# Patient Record
Sex: Female | Born: 1950
Health system: Southern US, Community
[De-identification: ages and names within clinical notes are randomized; demographics above are authoritative.]

## PROBLEM LIST (undated history)

## (undated) DIAGNOSIS — E785 Hyperlipidemia, unspecified: Secondary | ICD-10-CM

## (undated) DIAGNOSIS — R9431 Abnormal electrocardiogram [ECG] [EKG]: Secondary | ICD-10-CM

## (undated) DIAGNOSIS — I10 Essential (primary) hypertension: Secondary | ICD-10-CM

## (undated) DIAGNOSIS — J302 Other seasonal allergic rhinitis: Secondary | ICD-10-CM

## (undated) DIAGNOSIS — K219 Gastro-esophageal reflux disease without esophagitis: Secondary | ICD-10-CM

## (undated) HISTORY — DX: Hyperlipidemia, unspecified: E78.5

## (undated) HISTORY — DX: Essential (primary) hypertension: I10

## (undated) HISTORY — DX: Abnormal electrocardiogram (ECG) (EKG): R94.31

## (undated) HISTORY — DX: Other seasonal allergic rhinitis: J30.2

## (undated) HISTORY — DX: Gastro-esophageal reflux disease without esophagitis: K21.9

---

## 1999-07-20 ENCOUNTER — Other Ambulatory Visit: Admission: RE | Admit: 1999-07-20 | Discharge: 1999-07-20 | Payer: Self-pay | Admitting: Obstetrics and Gynecology

## 2000-08-14 ENCOUNTER — Other Ambulatory Visit: Admission: RE | Admit: 2000-08-14 | Discharge: 2000-08-14 | Payer: Self-pay | Admitting: Obstetrics and Gynecology

## 2001-09-17 ENCOUNTER — Other Ambulatory Visit: Admission: RE | Admit: 2001-09-17 | Discharge: 2001-09-17 | Payer: Self-pay | Admitting: Obstetrics and Gynecology

## 2001-10-02 ENCOUNTER — Encounter: Payer: Self-pay | Admitting: Obstetrics and Gynecology

## 2001-10-02 ENCOUNTER — Ambulatory Visit (HOSPITAL_COMMUNITY): Admission: RE | Admit: 2001-10-02 | Discharge: 2001-10-02 | Payer: Self-pay | Admitting: Obstetrics and Gynecology

## 2003-02-10 ENCOUNTER — Encounter: Payer: Self-pay | Admitting: Obstetrics and Gynecology

## 2003-02-10 ENCOUNTER — Ambulatory Visit (HOSPITAL_COMMUNITY): Admission: RE | Admit: 2003-02-10 | Discharge: 2003-02-10 | Payer: Self-pay | Admitting: Obstetrics and Gynecology

## 2003-02-12 ENCOUNTER — Other Ambulatory Visit: Admission: RE | Admit: 2003-02-12 | Discharge: 2003-02-12 | Payer: Self-pay | Admitting: Obstetrics and Gynecology

## 2003-02-27 ENCOUNTER — Ambulatory Visit (HOSPITAL_COMMUNITY): Admission: RE | Admit: 2003-02-27 | Discharge: 2003-02-27 | Payer: Self-pay | Admitting: Oncology

## 2003-02-27 ENCOUNTER — Encounter: Payer: Self-pay | Admitting: Oncology

## 2005-06-16 ENCOUNTER — Ambulatory Visit (HOSPITAL_COMMUNITY): Admission: RE | Admit: 2005-06-16 | Discharge: 2005-06-16 | Payer: Self-pay | Admitting: Internal Medicine

## 2005-08-11 ENCOUNTER — Ambulatory Visit (HOSPITAL_COMMUNITY): Admission: RE | Admit: 2005-08-11 | Discharge: 2005-08-11 | Payer: Self-pay | Admitting: Gastroenterology

## 2007-09-09 ENCOUNTER — Ambulatory Visit (HOSPITAL_COMMUNITY): Admission: RE | Admit: 2007-09-09 | Discharge: 2007-09-09 | Payer: Self-pay | Admitting: Internal Medicine

## 2009-07-09 ENCOUNTER — Encounter: Admission: RE | Admit: 2009-07-09 | Discharge: 2009-07-09 | Payer: Self-pay | Admitting: Interventional Cardiology

## 2012-10-03 ENCOUNTER — Other Ambulatory Visit (HOSPITAL_COMMUNITY): Payer: Self-pay | Admitting: Internal Medicine

## 2012-10-03 DIAGNOSIS — I10 Essential (primary) hypertension: Secondary | ICD-10-CM

## 2012-10-28 ENCOUNTER — Ambulatory Visit (HOSPITAL_COMMUNITY)
Admission: RE | Admit: 2012-10-28 | Discharge: 2012-10-28 | Disposition: A | Discharge status: Home / Self Care | Payer: Federal, State, Local not specified - PPO | Source: Ambulatory Visit | Attending: Cardiovascular Disease | Admitting: Cardiovascular Disease

## 2012-10-28 DIAGNOSIS — I10 Essential (primary) hypertension: Secondary | ICD-10-CM | POA: Insufficient documentation

## 2012-10-28 NOTE — Progress Notes (Signed)
Renal duplex completed.  

## 2014-03-30 ENCOUNTER — Encounter (INDEPENDENT_AMBULATORY_CARE_PROVIDER_SITE_OTHER): Payer: Self-pay

## 2014-03-30 ENCOUNTER — Ambulatory Visit (INDEPENDENT_AMBULATORY_CARE_PROVIDER_SITE_OTHER): Payer: Medicare Other | Admitting: Interventional Cardiology

## 2014-03-30 ENCOUNTER — Encounter: Payer: Self-pay | Admitting: Interventional Cardiology

## 2014-03-30 VITALS — BP 160/100 | HR 60 | Ht 61.0 in | Wt 147.0 lb

## 2014-03-30 DIAGNOSIS — E785 Hyperlipidemia, unspecified: Secondary | ICD-10-CM

## 2014-03-30 DIAGNOSIS — J309 Allergic rhinitis, unspecified: Secondary | ICD-10-CM | POA: Diagnosis not present

## 2014-03-30 DIAGNOSIS — J302 Other seasonal allergic rhinitis: Secondary | ICD-10-CM

## 2014-03-30 DIAGNOSIS — K219 Gastro-esophageal reflux disease without esophagitis: Secondary | ICD-10-CM | POA: Diagnosis not present

## 2014-03-30 DIAGNOSIS — I10 Essential (primary) hypertension: Secondary | ICD-10-CM | POA: Diagnosis not present

## 2014-03-30 DIAGNOSIS — R9431 Abnormal electrocardiogram [ECG] [EKG]: Secondary | ICD-10-CM | POA: Diagnosis not present

## 2014-03-30 NOTE — Patient Instructions (Signed)
Your physician recommends that you continue on your current medications as directed. Please refer to the Current Medication list given to you today.  Your physician has requested that you have an echocardiogram. Echocardiography is a painless test that uses sound waves to create images of your heart. It provides your doctor with information about the size and shape of your heart and how well your heart's chambers and valves are working. This procedure takes approximately one hour. There are no restrictions for this procedure.  Follow up pending Results

## 2014-03-30 NOTE — Progress Notes (Signed)
Patient ID: Destiny James, female   DOB: 26-Feb-1951, 63 y.o.   MRN: [id number]    [address], [address] [place], [place]  [address] Phone: [phone] Fax:  [phone]  Date:  03/30/2014   ID:  Destiny James, DOB [birth year], MRN [id number]  PCP:  No primary provider on file.   ASSESSMENT: 1. Hypertension, with elevated BP today. The issue here is that she feels her pressure is only elevated at times and physician's offices. I have always been concerned that she has undertreated/untreated hypertension. Current regimen is only Tenormin 12.5 mg per day. 2. Abnormal EKG with diffuse T wave abnormality, a new finding compared with prior EKG from 5 years ago. 3. Hyperlipidemia 4. Seasonal allergies   PLAN:  1. 2-D Doppler echocardiogram to evaluate for regional wall motion abnormality and LVH. 2. Further recommendations upon results of echo. I suspect the echo demonstrates significant LVH and that it may be reasonable for [place] to intensify antihypertensive therapy. 3. Management of her blood pressure will require her acceptance of the diagnosis and compliance with medical regimen   SUBJECTIVE: Destiny James is a 63 y.o. female who was seen in greater than 5 years ago at [place]. At that time she was having chest pain. Evaluation with a myocardial perfusion study was unremarkable. Was later felt the chest discomfort was related to reflux. There was concern at that time that her blood pressure was being undertreated.   Wt Readings from Last 3 Encounters:  03/30/14 147 lb (66.679 kg)    Past Medical History  Diagnosis Date  . Hyperlipidemia   . Hypertension   . Seasonal allergies   . Abnormal EKG 03/31/2014    Diffuse T-wave abnormality   . Esophageal reflux 03/31/2014   Family History  Problem Relation Age of Onset  . Other Mother    Current Outpatient Prescriptions  Medication Sig Dispense Refill  . atenolol (TENORMIN) 25 MG tablet Take 12.5 mg by mouth daily.       No current  facility-administered medications for this visit.    Allergies:   Allergies not on file  Social History:  The patient  reports that she has never smoked. She does not have any smokeless tobacco history on file.   ROS:  Please see the history of present illness.   No difficulty swallowing. Allergies continue to be a problem. She takes multiple supplements. The patient feels that she does not have a problem with hypertension. She prefers to manage any medical problem naturally. She was resistant to more aggressive therapy recommended 5 years ago the  All other systems reviewed and negative.   OBJECTIVE: VS:  BP 160/100  Pulse 60  Ht 5\' 1"  (1.549 m)  Wt 147 lb (66.679 kg)  BMI 27.79 kg/m2 Well nourished, well developed, in no acute distress, younger than stated age 43: normal Neck: JVD flat. Carotid bruit absent  Cardiac:  normal S1, S2; RRR; no murmur. An S4 gallop is audible Lungs:  clear to auscultation bilaterally, no wheezing, rhonchi or rales Abd: soft, nontender, no hepatomegaly Ext: Edema absent. Pulses 2+ Skin: warm and dry Neuro:  CNs 2-12 intact, no focal abnormalities noted  EKG:  Normal sinus rhythm with diffuse symmetrical T wave abnormality. Ischemia versus secondary changes of LVH should be considered. This is a change when compared to an EKG from 2010.       Signed, Illene Labrador III, MD 03/30/2014 9:26 AM

## 2014-03-31 ENCOUNTER — Encounter: Payer: Self-pay | Admitting: Interventional Cardiology

## 2014-03-31 DIAGNOSIS — E785 Hyperlipidemia, unspecified: Secondary | ICD-10-CM | POA: Insufficient documentation

## 2014-03-31 DIAGNOSIS — K219 Gastro-esophageal reflux disease without esophagitis: Secondary | ICD-10-CM

## 2014-03-31 DIAGNOSIS — J302 Other seasonal allergic rhinitis: Secondary | ICD-10-CM | POA: Insufficient documentation

## 2014-03-31 DIAGNOSIS — R9431 Abnormal electrocardiogram [ECG] [EKG]: Secondary | ICD-10-CM

## 2014-03-31 DIAGNOSIS — I10 Essential (primary) hypertension: Secondary | ICD-10-CM | POA: Insufficient documentation

## 2014-03-31 HISTORY — DX: Gastro-esophageal reflux disease without esophagitis: K21.9

## 2014-03-31 HISTORY — DX: Abnormal electrocardiogram (ECG) (EKG): R94.31

## 2014-04-16 ENCOUNTER — Ambulatory Visit (HOSPITAL_COMMUNITY): Payer: Medicare Other | Attending: Cardiology | Admitting: Radiology

## 2014-04-16 DIAGNOSIS — R9431 Abnormal electrocardiogram [ECG] [EKG]: Secondary | ICD-10-CM | POA: Insufficient documentation

## 2014-04-16 DIAGNOSIS — I379 Nonrheumatic pulmonary valve disorder, unspecified: Secondary | ICD-10-CM | POA: Diagnosis not present

## 2014-04-16 DIAGNOSIS — I10 Essential (primary) hypertension: Secondary | ICD-10-CM | POA: Diagnosis not present

## 2014-04-16 DIAGNOSIS — E785 Hyperlipidemia, unspecified: Secondary | ICD-10-CM | POA: Diagnosis not present

## 2014-04-16 DIAGNOSIS — I059 Rheumatic mitral valve disease, unspecified: Secondary | ICD-10-CM | POA: Diagnosis not present

## 2014-04-16 NOTE — Progress Notes (Signed)
Echocardiogram performed.  

## 2014-04-20 ENCOUNTER — Encounter (HOSPITAL_COMMUNITY): Payer: Self-pay | Admitting: Interventional Cardiology

## 2014-04-21 ENCOUNTER — Telehealth: Payer: Self-pay

## 2014-04-21 NOTE — Telephone Encounter (Signed)
Message copied by Lamar Laundry on Tue Apr 21, 2014  8:40 AM ------      Message from: Daneen Schick      Created: Fri Apr 17, 2014  4:58 PM       Let her know study shows mild thickening of the heart muscle. He need to pay close attention to BP control < 130 mmHg ------

## 2014-04-21 NOTE — Telephone Encounter (Signed)
Message copied by Lamar Laundry on Tue Apr 21, 2014  5:16 PM ------      Message from: Daneen Schick      Created: Fri Apr 17, 2014  4:58 PM       Let her know study shows mild thickening of the heart muscle. He need to pay close attention to BP control < 130 mmHg ------

## 2014-04-21 NOTE — Telephone Encounter (Signed)
Follow up    Pt returning Lisa's call.

## 2014-04-21 NOTE — Telephone Encounter (Signed)
pt aware of echo results and Dr.smith instructions.the echoshows mild thickening of the heart muscle.she need to pay close attention to BP control < 130 mmHg.pt adv to monitor bp 1-2 times weekly and to call the office if she having consistent elevated readings.pt agreeable and verbalized understanding

## 2014-04-21 NOTE — Telephone Encounter (Signed)
called to give pt echo results and Dr.Smith's instructions lmtcb.

## 2014-04-22 ENCOUNTER — Telehealth: Payer: Self-pay

## 2014-04-22 MED ORDER — ATENOLOL 25 MG PO TABS: 25.0000 mg | ORAL_TABLET | Freq: Every day | ORAL | Status: AC

## 2014-04-22 NOTE — Telephone Encounter (Signed)
Message copied by Lamar Laundry on Wed Apr 22, 2014  2:52 PM ------      Message from: Daneen Schick      Created: Wed Apr 22, 2014  1:53 PM       Increase the atenolol to 25 mg daily and measure BP 2-3 times a week for 1 month. Low salt diet. Aerobic exercise, starting slow and gradually building. ------

## 2014-04-22 NOTE — Telephone Encounter (Signed)
Dr.Smith.pt given Dr.Smiths recommendations.Increase the atenolol to 25 mg daily and measure BP 2-3 times a week for 1 month. Low salt diet. Aerobic exercise, starting slow and gradually building. Pt agreeable and verbalized understanding

## 2014-04-23 NOTE — Telephone Encounter (Signed)
error 

## 2014-05-25 DIAGNOSIS — H02409 Unspecified ptosis of unspecified eyelid: Secondary | ICD-10-CM | POA: Diagnosis not present

## 2014-07-29 DIAGNOSIS — Z79899 Other long term (current) drug therapy: Secondary | ICD-10-CM | POA: Diagnosis not present

## 2014-07-29 DIAGNOSIS — I10 Essential (primary) hypertension: Secondary | ICD-10-CM | POA: Diagnosis not present

## 2014-08-14 ENCOUNTER — Other Ambulatory Visit: Payer: Self-pay

## 2015-02-01 DIAGNOSIS — I10 Essential (primary) hypertension: Secondary | ICD-10-CM | POA: Diagnosis not present

## 2015-03-04 ENCOUNTER — Ambulatory Visit: Payer: Medicare Other | Admitting: Internal Medicine

## 2015-03-08 ENCOUNTER — Ambulatory Visit: Payer: Medicare Other | Admitting: Internal Medicine

## 2015-04-26 ENCOUNTER — Other Ambulatory Visit: Payer: Self-pay

## 2015-06-01 DIAGNOSIS — H10402 Unspecified chronic conjunctivitis, left eye: Secondary | ICD-10-CM | POA: Diagnosis not present

## 2015-06-01 DIAGNOSIS — H5213 Myopia, bilateral: Secondary | ICD-10-CM | POA: Diagnosis not present

## 2015-06-23 DIAGNOSIS — H0011 Chalazion right upper eyelid: Secondary | ICD-10-CM | POA: Diagnosis not present

## 2015-07-12 DIAGNOSIS — E785 Hyperlipidemia, unspecified: Secondary | ICD-10-CM | POA: Diagnosis not present

## 2015-07-12 DIAGNOSIS — I340 Nonrheumatic mitral (valve) insufficiency: Secondary | ICD-10-CM | POA: Diagnosis not present

## 2015-07-12 DIAGNOSIS — K219 Gastro-esophageal reflux disease without esophagitis: Secondary | ICD-10-CM | POA: Diagnosis not present

## 2015-07-12 DIAGNOSIS — D72819 Decreased white blood cell count, unspecified: Secondary | ICD-10-CM | POA: Diagnosis not present

## 2015-07-12 DIAGNOSIS — I10 Essential (primary) hypertension: Secondary | ICD-10-CM | POA: Diagnosis not present

## 2015-07-12 DIAGNOSIS — Z6827 Body mass index (BMI) 27.0-27.9, adult: Secondary | ICD-10-CM | POA: Diagnosis not present

## 2015-07-12 DIAGNOSIS — J302 Other seasonal allergic rhinitis: Secondary | ICD-10-CM | POA: Diagnosis not present

## 2015-07-12 DIAGNOSIS — D259 Leiomyoma of uterus, unspecified: Secondary | ICD-10-CM | POA: Diagnosis not present

## 2015-07-12 DIAGNOSIS — E663 Overweight: Secondary | ICD-10-CM | POA: Diagnosis not present

## 2015-07-12 DIAGNOSIS — Z23 Encounter for immunization: Secondary | ICD-10-CM | POA: Diagnosis not present

## 2015-07-12 DIAGNOSIS — R9431 Abnormal electrocardiogram [ECG] [EKG]: Secondary | ICD-10-CM | POA: Diagnosis not present

## 2015-07-12 DIAGNOSIS — K7689 Other specified diseases of liver: Secondary | ICD-10-CM | POA: Diagnosis not present

## 2015-07-15 DIAGNOSIS — K7689 Other specified diseases of liver: Secondary | ICD-10-CM | POA: Diagnosis not present

## 2015-07-23 ENCOUNTER — Other Ambulatory Visit: Payer: Self-pay | Admitting: Internal Medicine

## 2015-07-23 DIAGNOSIS — Z1231 Encounter for screening mammogram for malignant neoplasm of breast: Secondary | ICD-10-CM

## 2015-07-26 DIAGNOSIS — E782 Mixed hyperlipidemia: Secondary | ICD-10-CM | POA: Diagnosis not present

## 2015-07-26 DIAGNOSIS — I10 Essential (primary) hypertension: Secondary | ICD-10-CM | POA: Diagnosis not present

## 2015-07-26 DIAGNOSIS — Z1211 Encounter for screening for malignant neoplasm of colon: Secondary | ICD-10-CM | POA: Diagnosis not present

## 2015-08-03 DIAGNOSIS — R030 Elevated blood-pressure reading, without diagnosis of hypertension: Secondary | ICD-10-CM | POA: Diagnosis not present

## 2015-08-03 DIAGNOSIS — Z6827 Body mass index (BMI) 27.0-27.9, adult: Secondary | ICD-10-CM | POA: Diagnosis not present

## 2015-08-10 ENCOUNTER — Ambulatory Visit
Admission: RE | Admit: 2015-08-10 | Discharge: 2015-08-10 | Disposition: A | Discharge status: Home / Self Care | Payer: Medicare Other | Source: Ambulatory Visit | Attending: Internal Medicine | Admitting: Internal Medicine

## 2015-08-10 DIAGNOSIS — Z1231 Encounter for screening mammogram for malignant neoplasm of breast: Secondary | ICD-10-CM | POA: Diagnosis not present

## 2015-08-13 ENCOUNTER — Other Ambulatory Visit: Payer: Self-pay | Admitting: Internal Medicine

## 2015-08-13 DIAGNOSIS — R928 Other abnormal and inconclusive findings on diagnostic imaging of breast: Secondary | ICD-10-CM

## 2015-08-13 DIAGNOSIS — R030 Elevated blood-pressure reading, without diagnosis of hypertension: Secondary | ICD-10-CM | POA: Diagnosis not present

## 2015-08-20 ENCOUNTER — Other Ambulatory Visit: Payer: Self-pay | Admitting: Internal Medicine

## 2015-08-20 ENCOUNTER — Ambulatory Visit
Admission: RE | Admit: 2015-08-20 | Discharge: 2015-08-20 | Disposition: A | Discharge status: Home / Self Care | Payer: Medicare Other | Source: Ambulatory Visit | Attending: Internal Medicine | Admitting: Internal Medicine

## 2015-08-20 DIAGNOSIS — R928 Other abnormal and inconclusive findings on diagnostic imaging of breast: Secondary | ICD-10-CM

## 2015-08-20 DIAGNOSIS — N63 Unspecified lump in breast: Secondary | ICD-10-CM | POA: Diagnosis not present

## 2015-08-20 DIAGNOSIS — N6310 Unspecified lump in the right breast, unspecified quadrant: Secondary | ICD-10-CM

## 2015-08-31 ENCOUNTER — Other Ambulatory Visit: Payer: Medicare Other

## 2015-09-03 DIAGNOSIS — I10 Essential (primary) hypertension: Secondary | ICD-10-CM | POA: Diagnosis not present

## 2015-09-03 DIAGNOSIS — R030 Elevated blood-pressure reading, without diagnosis of hypertension: Secondary | ICD-10-CM | POA: Diagnosis not present

## 2015-09-03 DIAGNOSIS — I517 Cardiomegaly: Secondary | ICD-10-CM | POA: Diagnosis not present

## 2015-09-03 DIAGNOSIS — R928 Other abnormal and inconclusive findings on diagnostic imaging of breast: Secondary | ICD-10-CM | POA: Diagnosis not present

## 2015-09-03 DIAGNOSIS — Z6827 Body mass index (BMI) 27.0-27.9, adult: Secondary | ICD-10-CM | POA: Diagnosis not present

## 2015-09-07 DIAGNOSIS — Z1211 Encounter for screening for malignant neoplasm of colon: Secondary | ICD-10-CM | POA: Diagnosis not present

## 2015-09-08 ENCOUNTER — Other Ambulatory Visit: Payer: Medicare Other

## 2016-01-14 DIAGNOSIS — R8290 Unspecified abnormal findings in urine: Secondary | ICD-10-CM | POA: Diagnosis not present

## 2016-01-14 DIAGNOSIS — N390 Urinary tract infection, site not specified: Secondary | ICD-10-CM | POA: Diagnosis not present

## 2016-01-14 DIAGNOSIS — E784 Other hyperlipidemia: Secondary | ICD-10-CM | POA: Diagnosis not present

## 2016-01-14 DIAGNOSIS — I10 Essential (primary) hypertension: Secondary | ICD-10-CM | POA: Diagnosis not present

## 2016-01-21 DIAGNOSIS — Z780 Asymptomatic menopausal state: Secondary | ICD-10-CM | POA: Diagnosis not present

## 2016-01-21 DIAGNOSIS — J302 Other seasonal allergic rhinitis: Secondary | ICD-10-CM | POA: Diagnosis not present

## 2016-01-21 DIAGNOSIS — D72819 Decreased white blood cell count, unspecified: Secondary | ICD-10-CM | POA: Diagnosis not present

## 2016-01-21 DIAGNOSIS — I340 Nonrheumatic mitral (valve) insufficiency: Secondary | ICD-10-CM | POA: Diagnosis not present

## 2016-01-21 DIAGNOSIS — R928 Other abnormal and inconclusive findings on diagnostic imaging of breast: Secondary | ICD-10-CM | POA: Diagnosis not present

## 2016-01-21 DIAGNOSIS — R030 Elevated blood-pressure reading, without diagnosis of hypertension: Secondary | ICD-10-CM | POA: Diagnosis not present

## 2016-01-21 DIAGNOSIS — Z6827 Body mass index (BMI) 27.0-27.9, adult: Secondary | ICD-10-CM | POA: Diagnosis not present

## 2016-01-21 DIAGNOSIS — D259 Leiomyoma of uterus, unspecified: Secondary | ICD-10-CM | POA: Diagnosis not present

## 2016-01-21 DIAGNOSIS — Z0000 Encounter for general adult medical examination without abnormal findings: Secondary | ICD-10-CM | POA: Diagnosis not present

## 2016-01-21 DIAGNOSIS — K219 Gastro-esophageal reflux disease without esophagitis: Secondary | ICD-10-CM | POA: Diagnosis not present

## 2016-01-21 DIAGNOSIS — I10 Essential (primary) hypertension: Secondary | ICD-10-CM | POA: Diagnosis not present

## 2016-01-21 DIAGNOSIS — Z1389 Encounter for screening for other disorder: Secondary | ICD-10-CM | POA: Diagnosis not present

## 2016-01-21 DIAGNOSIS — E784 Other hyperlipidemia: Secondary | ICD-10-CM | POA: Diagnosis not present

## 2016-06-14 DIAGNOSIS — H5213 Myopia, bilateral: Secondary | ICD-10-CM | POA: Diagnosis not present

## 2016-06-14 DIAGNOSIS — H02401 Unspecified ptosis of right eyelid: Secondary | ICD-10-CM | POA: Diagnosis not present

## 2016-07-25 DIAGNOSIS — I129 Hypertensive chronic kidney disease with stage 1 through stage 4 chronic kidney disease, or unspecified chronic kidney disease: Secondary | ICD-10-CM | POA: Diagnosis not present

## 2016-07-25 DIAGNOSIS — R928 Other abnormal and inconclusive findings on diagnostic imaging of breast: Secondary | ICD-10-CM | POA: Diagnosis not present

## 2016-07-25 DIAGNOSIS — Z6828 Body mass index (BMI) 28.0-28.9, adult: Secondary | ICD-10-CM | POA: Diagnosis not present

## 2016-07-25 DIAGNOSIS — R030 Elevated blood-pressure reading, without diagnosis of hypertension: Secondary | ICD-10-CM | POA: Diagnosis not present

## 2016-07-25 DIAGNOSIS — E663 Overweight: Secondary | ICD-10-CM | POA: Diagnosis not present

## 2016-08-08 DIAGNOSIS — N6310 Unspecified lump in the right breast, unspecified quadrant: Secondary | ICD-10-CM | POA: Diagnosis not present

## 2016-08-08 DIAGNOSIS — N6314 Unspecified lump in the right breast, lower inner quadrant: Secondary | ICD-10-CM | POA: Diagnosis not present

## 2016-11-10 DIAGNOSIS — Z6828 Body mass index (BMI) 28.0-28.9, adult: Secondary | ICD-10-CM | POA: Diagnosis not present

## 2016-11-10 DIAGNOSIS — I10 Essential (primary) hypertension: Secondary | ICD-10-CM | POA: Diagnosis not present

## 2016-11-10 DIAGNOSIS — R002 Palpitations: Secondary | ICD-10-CM | POA: Diagnosis not present

## 2017-01-15 DIAGNOSIS — I10 Essential (primary) hypertension: Secondary | ICD-10-CM | POA: Diagnosis not present

## 2017-01-15 DIAGNOSIS — E784 Other hyperlipidemia: Secondary | ICD-10-CM | POA: Diagnosis not present

## 2017-01-22 DIAGNOSIS — R002 Palpitations: Secondary | ICD-10-CM | POA: Diagnosis not present

## 2017-01-22 DIAGNOSIS — D259 Leiomyoma of uterus, unspecified: Secondary | ICD-10-CM | POA: Diagnosis not present

## 2017-01-22 DIAGNOSIS — J302 Other seasonal allergic rhinitis: Secondary | ICD-10-CM | POA: Diagnosis not present

## 2017-01-22 DIAGNOSIS — Z23 Encounter for immunization: Secondary | ICD-10-CM | POA: Diagnosis not present

## 2017-01-22 DIAGNOSIS — E784 Other hyperlipidemia: Secondary | ICD-10-CM | POA: Diagnosis not present

## 2017-01-22 DIAGNOSIS — R030 Elevated blood-pressure reading, without diagnosis of hypertension: Secondary | ICD-10-CM | POA: Diagnosis not present

## 2017-01-22 DIAGNOSIS — Z0000 Encounter for general adult medical examination without abnormal findings: Secondary | ICD-10-CM | POA: Diagnosis not present

## 2017-01-22 DIAGNOSIS — D72819 Decreased white blood cell count, unspecified: Secondary | ICD-10-CM | POA: Diagnosis not present

## 2017-01-22 DIAGNOSIS — E663 Overweight: Secondary | ICD-10-CM | POA: Diagnosis not present

## 2017-01-22 DIAGNOSIS — I129 Hypertensive chronic kidney disease with stage 1 through stage 4 chronic kidney disease, or unspecified chronic kidney disease: Secondary | ICD-10-CM | POA: Diagnosis not present

## 2017-01-22 DIAGNOSIS — Z6827 Body mass index (BMI) 27.0-27.9, adult: Secondary | ICD-10-CM | POA: Diagnosis not present

## 2017-01-22 DIAGNOSIS — I340 Nonrheumatic mitral (valve) insufficiency: Secondary | ICD-10-CM | POA: Diagnosis not present

## 2017-06-18 DIAGNOSIS — H5213 Myopia, bilateral: Secondary | ICD-10-CM | POA: Diagnosis not present

## 2017-06-18 DIAGNOSIS — H2513 Age-related nuclear cataract, bilateral: Secondary | ICD-10-CM | POA: Diagnosis not present

## 2018-01-17 DIAGNOSIS — Z1231 Encounter for screening mammogram for malignant neoplasm of breast: Secondary | ICD-10-CM | POA: Diagnosis not present

## 2018-01-17 DIAGNOSIS — R82998 Other abnormal findings in urine: Secondary | ICD-10-CM | POA: Diagnosis not present

## 2018-01-17 DIAGNOSIS — E7849 Other hyperlipidemia: Secondary | ICD-10-CM | POA: Diagnosis not present

## 2018-01-17 DIAGNOSIS — I10 Essential (primary) hypertension: Secondary | ICD-10-CM | POA: Diagnosis not present

## 2018-01-24 DIAGNOSIS — R808 Other proteinuria: Secondary | ICD-10-CM | POA: Diagnosis not present

## 2018-01-24 DIAGNOSIS — I129 Hypertensive chronic kidney disease with stage 1 through stage 4 chronic kidney disease, or unspecified chronic kidney disease: Secondary | ICD-10-CM | POA: Diagnosis not present

## 2018-01-24 DIAGNOSIS — R002 Palpitations: Secondary | ICD-10-CM | POA: Diagnosis not present

## 2018-01-24 DIAGNOSIS — Z0000 Encounter for general adult medical examination without abnormal findings: Secondary | ICD-10-CM | POA: Diagnosis not present

## 2018-01-24 DIAGNOSIS — R030 Elevated blood-pressure reading, without diagnosis of hypertension: Secondary | ICD-10-CM | POA: Diagnosis not present

## 2018-01-24 DIAGNOSIS — N182 Chronic kidney disease, stage 2 (mild): Secondary | ICD-10-CM | POA: Diagnosis not present

## 2018-01-24 DIAGNOSIS — D72819 Decreased white blood cell count, unspecified: Secondary | ICD-10-CM | POA: Diagnosis not present

## 2018-01-24 DIAGNOSIS — Z1389 Encounter for screening for other disorder: Secondary | ICD-10-CM | POA: Diagnosis not present

## 2018-01-24 DIAGNOSIS — M65319 Trigger thumb, unspecified thumb: Secondary | ICD-10-CM | POA: Diagnosis not present

## 2018-01-24 DIAGNOSIS — Z6828 Body mass index (BMI) 28.0-28.9, adult: Secondary | ICD-10-CM | POA: Diagnosis not present

## 2018-06-24 DIAGNOSIS — H40053 Ocular hypertension, bilateral: Secondary | ICD-10-CM | POA: Diagnosis not present

## 2018-06-24 DIAGNOSIS — H40023 Open angle with borderline findings, high risk, bilateral: Secondary | ICD-10-CM | POA: Diagnosis not present

## 2018-06-24 DIAGNOSIS — H5213 Myopia, bilateral: Secondary | ICD-10-CM | POA: Diagnosis not present

## 2018-08-27 DIAGNOSIS — H401131 Primary open-angle glaucoma, bilateral, mild stage: Secondary | ICD-10-CM | POA: Diagnosis not present

## 2018-09-13 DIAGNOSIS — H401131 Primary open-angle glaucoma, bilateral, mild stage: Secondary | ICD-10-CM | POA: Diagnosis not present

## 2019-01-23 DIAGNOSIS — E7849 Other hyperlipidemia: Secondary | ICD-10-CM | POA: Diagnosis not present

## 2019-01-23 DIAGNOSIS — R7309 Other abnormal glucose: Secondary | ICD-10-CM | POA: Diagnosis not present

## 2019-01-23 DIAGNOSIS — I10 Essential (primary) hypertension: Secondary | ICD-10-CM | POA: Diagnosis not present

## 2019-01-24 DIAGNOSIS — R82998 Other abnormal findings in urine: Secondary | ICD-10-CM | POA: Diagnosis not present

## 2019-01-24 DIAGNOSIS — I129 Hypertensive chronic kidney disease with stage 1 through stage 4 chronic kidney disease, or unspecified chronic kidney disease: Secondary | ICD-10-CM | POA: Diagnosis not present

## 2019-01-30 DIAGNOSIS — I129 Hypertensive chronic kidney disease with stage 1 through stage 4 chronic kidney disease, or unspecified chronic kidney disease: Secondary | ICD-10-CM | POA: Diagnosis not present

## 2019-01-30 DIAGNOSIS — R002 Palpitations: Secondary | ICD-10-CM | POA: Diagnosis not present

## 2019-01-30 DIAGNOSIS — K219 Gastro-esophageal reflux disease without esophagitis: Secondary | ICD-10-CM | POA: Diagnosis not present

## 2019-01-30 DIAGNOSIS — R739 Hyperglycemia, unspecified: Secondary | ICD-10-CM | POA: Diagnosis not present

## 2019-01-30 DIAGNOSIS — R351 Nocturia: Secondary | ICD-10-CM | POA: Diagnosis not present

## 2019-01-30 DIAGNOSIS — H409 Unspecified glaucoma: Secondary | ICD-10-CM | POA: Diagnosis not present

## 2019-01-30 DIAGNOSIS — R1310 Dysphagia, unspecified: Secondary | ICD-10-CM | POA: Diagnosis not present

## 2019-01-30 DIAGNOSIS — E785 Hyperlipidemia, unspecified: Secondary | ICD-10-CM | POA: Diagnosis not present

## 2019-01-30 DIAGNOSIS — M65319 Trigger thumb, unspecified thumb: Secondary | ICD-10-CM | POA: Diagnosis not present

## 2019-01-30 DIAGNOSIS — Z0000 Encounter for general adult medical examination without abnormal findings: Secondary | ICD-10-CM | POA: Diagnosis not present

## 2019-01-30 DIAGNOSIS — N182 Chronic kidney disease, stage 2 (mild): Secondary | ICD-10-CM | POA: Diagnosis not present

## 2019-01-30 DIAGNOSIS — M25511 Pain in right shoulder: Secondary | ICD-10-CM | POA: Diagnosis not present

## 2019-07-30 DIAGNOSIS — H2513 Age-related nuclear cataract, bilateral: Secondary | ICD-10-CM | POA: Diagnosis not present

## 2019-07-30 DIAGNOSIS — H5213 Myopia, bilateral: Secondary | ICD-10-CM | POA: Diagnosis not present

## 2019-07-30 DIAGNOSIS — H401131 Primary open-angle glaucoma, bilateral, mild stage: Secondary | ICD-10-CM | POA: Diagnosis not present

## 2019-08-04 DIAGNOSIS — I129 Hypertensive chronic kidney disease with stage 1 through stage 4 chronic kidney disease, or unspecified chronic kidney disease: Secondary | ICD-10-CM | POA: Diagnosis not present

## 2019-08-04 DIAGNOSIS — E663 Overweight: Secondary | ICD-10-CM | POA: Diagnosis not present

## 2019-08-04 DIAGNOSIS — M542 Cervicalgia: Secondary | ICD-10-CM | POA: Diagnosis not present

## 2019-08-04 DIAGNOSIS — H409 Unspecified glaucoma: Secondary | ICD-10-CM | POA: Diagnosis not present

## 2019-12-07 ENCOUNTER — Ambulatory Visit: Payer: Medicare Other

## 2019-12-12 ENCOUNTER — Ambulatory Visit: Payer: Medicare Other | Attending: Internal Medicine

## 2019-12-12 DIAGNOSIS — Z23 Encounter for immunization: Secondary | ICD-10-CM | POA: Insufficient documentation

## 2019-12-12 NOTE — Progress Notes (Signed)
   Covid-19 Vaccination Clinic  Name:  Destiny James    MRN: [id number] DOB: 11-03-1950  12/12/2019  [name] was observed post Covid-19 immunization for 15 minutes without incidence. She was provided with Vaccine Information Sheet and instruction to access the V-Safe system.   [name] was instructed to call 911 with any severe reactions post vaccine: Marland Kitchen Difficulty breathing  . Swelling of your face and throat  . A fast heartbeat  . A bad rash all over your body  . Dizziness and weakness    Immunizations Administered    Name Date Dose VIS Date Route   Pfizer COVID-19 Vaccine 12/12/2019  9:10 AM 0.3 mL 10/10/2019 Intramuscular   Manufacturer: [place]   Lot: [id number]   [place]: [id number]

## 2019-12-28 ENCOUNTER — Ambulatory Visit: Payer: Medicare Other

## 2020-01-03 ENCOUNTER — Ambulatory Visit: Payer: Medicare Other | Attending: Internal Medicine

## 2020-01-03 DIAGNOSIS — Z23 Encounter for immunization: Secondary | ICD-10-CM | POA: Insufficient documentation

## 2020-01-03 NOTE — Progress Notes (Signed)
   Covid-19 Vaccination Clinic  Name:  HATTY TRIMPE    MRN: [id number] DOB: [birth year]  01/03/2020  Ms. Bogin was observed post Covid-19 immunization for 15 minutes without incident. She was provided with Vaccine Information Sheet and instruction to access the V-Safe system.   Ms. Patras was instructed to call 911 with any severe reactions post vaccine: Marland Kitchen Difficulty breathing  . Swelling of face and throat  . A fast heartbeat  . A bad rash all over body  . Dizziness and weakness   Immunizations Administered    Name Date Dose VIS Date Route   Pfizer COVID-19 Vaccine 01/03/2020  2:53 PM 0.3 mL 10/10/2019 Intramuscular   Manufacturer: [place]   Lot: [id number]   [place]: [id number]

## 2020-01-28 DIAGNOSIS — H401131 Primary open-angle glaucoma, bilateral, mild stage: Secondary | ICD-10-CM | POA: Diagnosis not present

## 2020-01-29 DIAGNOSIS — E7849 Other hyperlipidemia: Secondary | ICD-10-CM | POA: Diagnosis not present

## 2020-01-29 DIAGNOSIS — R739 Hyperglycemia, unspecified: Secondary | ICD-10-CM | POA: Diagnosis not present

## 2020-02-06 DIAGNOSIS — M542 Cervicalgia: Secondary | ICD-10-CM | POA: Diagnosis not present

## 2020-02-06 DIAGNOSIS — R1310 Dysphagia, unspecified: Secondary | ICD-10-CM | POA: Diagnosis not present

## 2020-02-06 DIAGNOSIS — M25511 Pain in right shoulder: Secondary | ICD-10-CM | POA: Diagnosis not present

## 2020-02-06 DIAGNOSIS — H409 Unspecified glaucoma: Secondary | ICD-10-CM | POA: Diagnosis not present

## 2020-02-06 DIAGNOSIS — R809 Proteinuria, unspecified: Secondary | ICD-10-CM | POA: Diagnosis not present

## 2020-02-06 DIAGNOSIS — R030 Elevated blood-pressure reading, without diagnosis of hypertension: Secondary | ICD-10-CM | POA: Diagnosis not present

## 2020-02-06 DIAGNOSIS — R82998 Other abnormal findings in urine: Secondary | ICD-10-CM | POA: Diagnosis not present

## 2020-02-06 DIAGNOSIS — Z1331 Encounter for screening for depression: Secondary | ICD-10-CM | POA: Diagnosis not present

## 2020-02-06 DIAGNOSIS — I340 Nonrheumatic mitral (valve) insufficiency: Secondary | ICD-10-CM | POA: Diagnosis not present

## 2020-02-06 DIAGNOSIS — Z0000 Encounter for general adult medical examination without abnormal findings: Secondary | ICD-10-CM | POA: Diagnosis not present

## 2020-02-06 DIAGNOSIS — R351 Nocturia: Secondary | ICD-10-CM | POA: Diagnosis not present

## 2020-02-06 DIAGNOSIS — R739 Hyperglycemia, unspecified: Secondary | ICD-10-CM | POA: Diagnosis not present

## 2020-02-06 DIAGNOSIS — R002 Palpitations: Secondary | ICD-10-CM | POA: Diagnosis not present

## 2020-02-06 DIAGNOSIS — I129 Hypertensive chronic kidney disease with stage 1 through stage 4 chronic kidney disease, or unspecified chronic kidney disease: Secondary | ICD-10-CM | POA: Diagnosis not present

## 2020-08-28 ENCOUNTER — Ambulatory Visit: Payer: Medicare Other | Attending: Internal Medicine

## 2020-08-28 DIAGNOSIS — Z23 Encounter for immunization: Secondary | ICD-10-CM

## 2020-08-28 NOTE — Progress Notes (Signed)
   Covid-19 Vaccination Clinic  Name:  Destiny James    MRN: [id number] DOB: 09/14/1951  08/28/2020  Destiny James was observed post Covid-19 immunization for 15 minutes without incident. She was provided with Vaccine Information Sheet and instruction to access the V-Safe system.   Destiny James was instructed to call 911 with any severe reactions post vaccine: Marland Kitchen Difficulty breathing  . Swelling of face and throat  . A fast heartbeat  . A bad rash all over body  . Dizziness and weakness

## 2020-10-18 DIAGNOSIS — H2513 Age-related nuclear cataract, bilateral: Secondary | ICD-10-CM | POA: Diagnosis not present

## 2020-10-18 DIAGNOSIS — H5213 Myopia, bilateral: Secondary | ICD-10-CM | POA: Diagnosis not present

## 2020-10-18 DIAGNOSIS — H401131 Primary open-angle glaucoma, bilateral, mild stage: Secondary | ICD-10-CM | POA: Diagnosis not present

## 2020-12-06 DIAGNOSIS — Z1231 Encounter for screening mammogram for malignant neoplasm of breast: Secondary | ICD-10-CM | POA: Diagnosis not present

## 2021-01-31 DIAGNOSIS — R739 Hyperglycemia, unspecified: Secondary | ICD-10-CM | POA: Diagnosis not present

## 2021-01-31 DIAGNOSIS — E785 Hyperlipidemia, unspecified: Secondary | ICD-10-CM | POA: Diagnosis not present

## 2021-02-07 DIAGNOSIS — R739 Hyperglycemia, unspecified: Secondary | ICD-10-CM | POA: Diagnosis not present

## 2021-02-07 DIAGNOSIS — Z1331 Encounter for screening for depression: Secondary | ICD-10-CM | POA: Diagnosis not present

## 2021-02-07 DIAGNOSIS — N939 Abnormal uterine and vaginal bleeding, unspecified: Secondary | ICD-10-CM | POA: Diagnosis not present

## 2021-02-07 DIAGNOSIS — I129 Hypertensive chronic kidney disease with stage 1 through stage 4 chronic kidney disease, or unspecified chronic kidney disease: Secondary | ICD-10-CM | POA: Diagnosis not present

## 2021-02-07 DIAGNOSIS — I340 Nonrheumatic mitral (valve) insufficiency: Secondary | ICD-10-CM | POA: Diagnosis not present

## 2021-02-07 DIAGNOSIS — I517 Cardiomegaly: Secondary | ICD-10-CM | POA: Diagnosis not present

## 2021-02-07 DIAGNOSIS — D259 Leiomyoma of uterus, unspecified: Secondary | ICD-10-CM | POA: Diagnosis not present

## 2021-02-07 DIAGNOSIS — R1310 Dysphagia, unspecified: Secondary | ICD-10-CM | POA: Diagnosis not present

## 2021-02-07 DIAGNOSIS — E785 Hyperlipidemia, unspecified: Secondary | ICD-10-CM | POA: Diagnosis not present

## 2021-02-07 DIAGNOSIS — Z1339 Encounter for screening examination for other mental health and behavioral disorders: Secondary | ICD-10-CM | POA: Diagnosis not present

## 2021-02-07 DIAGNOSIS — E663 Overweight: Secondary | ICD-10-CM | POA: Diagnosis not present

## 2021-02-07 DIAGNOSIS — N182 Chronic kidney disease, stage 2 (mild): Secondary | ICD-10-CM | POA: Diagnosis not present

## 2021-02-07 DIAGNOSIS — Z0000 Encounter for general adult medical examination without abnormal findings: Secondary | ICD-10-CM | POA: Diagnosis not present

## 2021-02-07 DIAGNOSIS — I10 Essential (primary) hypertension: Secondary | ICD-10-CM | POA: Diagnosis not present

## 2021-02-07 DIAGNOSIS — R82998 Other abnormal findings in urine: Secondary | ICD-10-CM | POA: Diagnosis not present

## 2021-04-08 DIAGNOSIS — N950 Postmenopausal bleeding: Secondary | ICD-10-CM | POA: Diagnosis not present

## 2021-04-08 DIAGNOSIS — D259 Leiomyoma of uterus, unspecified: Secondary | ICD-10-CM | POA: Diagnosis not present

## 2021-04-08 DIAGNOSIS — Z6827 Body mass index (BMI) 27.0-27.9, adult: Secondary | ICD-10-CM | POA: Diagnosis not present

## 2021-04-08 DIAGNOSIS — Z124 Encounter for screening for malignant neoplasm of cervix: Secondary | ICD-10-CM | POA: Diagnosis not present

## 2021-04-08 DIAGNOSIS — N841 Polyp of cervix uteri: Secondary | ICD-10-CM | POA: Diagnosis not present

## 2021-04-20 DIAGNOSIS — H401131 Primary open-angle glaucoma, bilateral, mild stage: Secondary | ICD-10-CM | POA: Diagnosis not present

## 2021-04-25 DIAGNOSIS — N950 Postmenopausal bleeding: Secondary | ICD-10-CM | POA: Diagnosis not present

## 2021-04-25 DIAGNOSIS — D259 Leiomyoma of uterus, unspecified: Secondary | ICD-10-CM | POA: Diagnosis not present

## 2021-04-25 DIAGNOSIS — N841 Polyp of cervix uteri: Secondary | ICD-10-CM | POA: Diagnosis not present

## 2021-04-25 DIAGNOSIS — Z01419 Encounter for gynecological examination (general) (routine) without abnormal findings: Secondary | ICD-10-CM | POA: Diagnosis not present

## 2021-05-11 DIAGNOSIS — D259 Leiomyoma of uterus, unspecified: Secondary | ICD-10-CM | POA: Diagnosis not present

## 2021-05-11 DIAGNOSIS — N950 Postmenopausal bleeding: Secondary | ICD-10-CM | POA: Diagnosis not present

## 2021-10-06 ENCOUNTER — Other Ambulatory Visit: Payer: Self-pay | Admitting: Internal Medicine

## 2021-10-06 ENCOUNTER — Ambulatory Visit
Admission: RE | Admit: 2021-10-06 | Discharge: 2021-10-06 | Disposition: A | Discharge status: Home / Self Care | Payer: Medicare Other | Source: Ambulatory Visit | Attending: Internal Medicine | Admitting: Internal Medicine

## 2021-10-06 ENCOUNTER — Other Ambulatory Visit: Payer: Self-pay

## 2021-10-06 DIAGNOSIS — R591 Generalized enlarged lymph nodes: Secondary | ICD-10-CM

## 2021-10-06 DIAGNOSIS — D72819 Decreased white blood cell count, unspecified: Secondary | ICD-10-CM | POA: Diagnosis not present

## 2021-10-06 DIAGNOSIS — D259 Leiomyoma of uterus, unspecified: Secondary | ICD-10-CM | POA: Diagnosis not present

## 2021-10-06 DIAGNOSIS — R590 Localized enlarged lymph nodes: Secondary | ICD-10-CM | POA: Diagnosis not present

## 2021-10-06 DIAGNOSIS — M47812 Spondylosis without myelopathy or radiculopathy, cervical region: Secondary | ICD-10-CM | POA: Diagnosis not present

## 2021-10-06 MED ORDER — IOPAMIDOL (ISOVUE-300) INJECTION 61%
75.0000 mL | Freq: Once | INTRAVENOUS | Status: AC | PRN
  Administered 2021-10-06: 75 mL via INTRAVENOUS

## 2021-11-02 DIAGNOSIS — H401131 Primary open-angle glaucoma, bilateral, mild stage: Secondary | ICD-10-CM | POA: Diagnosis not present

## 2021-11-02 DIAGNOSIS — H5213 Myopia, bilateral: Secondary | ICD-10-CM | POA: Diagnosis not present

## 2021-11-02 DIAGNOSIS — H2513 Age-related nuclear cataract, bilateral: Secondary | ICD-10-CM | POA: Diagnosis not present

## 2021-11-08 DIAGNOSIS — D72819 Decreased white blood cell count, unspecified: Secondary | ICD-10-CM | POA: Diagnosis not present

## 2021-12-12 DIAGNOSIS — Z1231 Encounter for screening mammogram for malignant neoplasm of breast: Secondary | ICD-10-CM | POA: Diagnosis not present

## 2022-02-17 DIAGNOSIS — R739 Hyperglycemia, unspecified: Secondary | ICD-10-CM | POA: Diagnosis not present

## 2022-02-17 DIAGNOSIS — I10 Essential (primary) hypertension: Secondary | ICD-10-CM | POA: Diagnosis not present

## 2022-02-17 DIAGNOSIS — E785 Hyperlipidemia, unspecified: Secondary | ICD-10-CM | POA: Diagnosis not present

## 2022-02-27 DIAGNOSIS — E663 Overweight: Secondary | ICD-10-CM | POA: Diagnosis not present

## 2022-02-27 DIAGNOSIS — R1310 Dysphagia, unspecified: Secondary | ICD-10-CM | POA: Diagnosis not present

## 2022-02-27 DIAGNOSIS — E785 Hyperlipidemia, unspecified: Secondary | ICD-10-CM | POA: Diagnosis not present

## 2022-02-27 DIAGNOSIS — R82998 Other abnormal findings in urine: Secondary | ICD-10-CM | POA: Diagnosis not present

## 2022-02-27 DIAGNOSIS — Z1331 Encounter for screening for depression: Secondary | ICD-10-CM | POA: Diagnosis not present

## 2022-02-27 DIAGNOSIS — R739 Hyperglycemia, unspecified: Secondary | ICD-10-CM | POA: Diagnosis not present

## 2022-02-27 DIAGNOSIS — N182 Chronic kidney disease, stage 2 (mild): Secondary | ICD-10-CM | POA: Diagnosis not present

## 2022-02-27 DIAGNOSIS — Z0000 Encounter for general adult medical examination without abnormal findings: Secondary | ICD-10-CM | POA: Diagnosis not present

## 2022-02-27 DIAGNOSIS — R351 Nocturia: Secondary | ICD-10-CM | POA: Diagnosis not present

## 2022-02-27 DIAGNOSIS — M542 Cervicalgia: Secondary | ICD-10-CM | POA: Diagnosis not present

## 2022-02-27 DIAGNOSIS — D72819 Decreased white blood cell count, unspecified: Secondary | ICD-10-CM | POA: Diagnosis not present

## 2022-02-27 DIAGNOSIS — Z1212 Encounter for screening for malignant neoplasm of rectum: Secondary | ICD-10-CM | POA: Diagnosis not present

## 2022-02-27 DIAGNOSIS — I10 Essential (primary) hypertension: Secondary | ICD-10-CM | POA: Diagnosis not present

## 2022-02-27 DIAGNOSIS — D259 Leiomyoma of uterus, unspecified: Secondary | ICD-10-CM | POA: Diagnosis not present

## 2022-02-27 DIAGNOSIS — K219 Gastro-esophageal reflux disease without esophagitis: Secondary | ICD-10-CM | POA: Diagnosis not present

## 2022-02-27 DIAGNOSIS — I129 Hypertensive chronic kidney disease with stage 1 through stage 4 chronic kidney disease, or unspecified chronic kidney disease: Secondary | ICD-10-CM | POA: Diagnosis not present

## 2022-05-03 DIAGNOSIS — H401131 Primary open-angle glaucoma, bilateral, mild stage: Secondary | ICD-10-CM | POA: Diagnosis not present

## 2022-06-10 DIAGNOSIS — H40053 Ocular hypertension, bilateral: Secondary | ICD-10-CM | POA: Diagnosis not present

## 2022-06-10 DIAGNOSIS — H5213 Myopia, bilateral: Secondary | ICD-10-CM | POA: Diagnosis not present

## 2022-06-10 DIAGNOSIS — H401130 Primary open-angle glaucoma, bilateral, stage unspecified: Secondary | ICD-10-CM | POA: Diagnosis not present

## 2022-06-22 DIAGNOSIS — D259 Leiomyoma of uterus, unspecified: Secondary | ICD-10-CM | POA: Diagnosis not present

## 2022-06-22 DIAGNOSIS — Z6828 Body mass index (BMI) 28.0-28.9, adult: Secondary | ICD-10-CM | POA: Diagnosis not present

## 2022-06-22 DIAGNOSIS — Z01419 Encounter for gynecological examination (general) (routine) without abnormal findings: Secondary | ICD-10-CM | POA: Diagnosis not present

## 2022-07-07 DIAGNOSIS — H2513 Age-related nuclear cataract, bilateral: Secondary | ICD-10-CM | POA: Diagnosis not present

## 2022-07-07 DIAGNOSIS — H40013 Open angle with borderline findings, low risk, bilateral: Secondary | ICD-10-CM | POA: Diagnosis not present

## 2022-12-18 DIAGNOSIS — Z1231 Encounter for screening mammogram for malignant neoplasm of breast: Secondary | ICD-10-CM | POA: Diagnosis not present

## 2023-03-05 DIAGNOSIS — E785 Hyperlipidemia, unspecified: Secondary | ICD-10-CM | POA: Diagnosis not present

## 2023-03-05 DIAGNOSIS — R739 Hyperglycemia, unspecified: Secondary | ICD-10-CM | POA: Diagnosis not present

## 2023-03-05 DIAGNOSIS — D72819 Decreased white blood cell count, unspecified: Secondary | ICD-10-CM | POA: Diagnosis not present

## 2023-03-05 DIAGNOSIS — R7989 Other specified abnormal findings of blood chemistry: Secondary | ICD-10-CM | POA: Diagnosis not present

## 2023-03-05 DIAGNOSIS — Z1212 Encounter for screening for malignant neoplasm of rectum: Secondary | ICD-10-CM | POA: Diagnosis not present

## 2023-03-05 DIAGNOSIS — K219 Gastro-esophageal reflux disease without esophagitis: Secondary | ICD-10-CM | POA: Diagnosis not present

## 2023-03-12 DIAGNOSIS — R809 Proteinuria, unspecified: Secondary | ICD-10-CM | POA: Diagnosis not present

## 2023-03-12 DIAGNOSIS — I129 Hypertensive chronic kidney disease with stage 1 through stage 4 chronic kidney disease, or unspecified chronic kidney disease: Secondary | ICD-10-CM | POA: Diagnosis not present

## 2023-03-12 DIAGNOSIS — Z0000 Encounter for general adult medical examination without abnormal findings: Secondary | ICD-10-CM | POA: Diagnosis not present

## 2023-03-12 DIAGNOSIS — R82998 Other abnormal findings in urine: Secondary | ICD-10-CM | POA: Diagnosis not present

## 2023-03-12 DIAGNOSIS — E785 Hyperlipidemia, unspecified: Secondary | ICD-10-CM | POA: Diagnosis not present

## 2023-03-12 DIAGNOSIS — I517 Cardiomegaly: Secondary | ICD-10-CM | POA: Diagnosis not present

## 2023-03-12 DIAGNOSIS — Z1331 Encounter for screening for depression: Secondary | ICD-10-CM | POA: Diagnosis not present

## 2023-03-12 DIAGNOSIS — I340 Nonrheumatic mitral (valve) insufficiency: Secondary | ICD-10-CM | POA: Diagnosis not present

## 2023-03-12 DIAGNOSIS — I10 Essential (primary) hypertension: Secondary | ICD-10-CM | POA: Diagnosis not present

## 2023-03-12 DIAGNOSIS — N182 Chronic kidney disease, stage 2 (mild): Secondary | ICD-10-CM | POA: Diagnosis not present

## 2023-03-12 DIAGNOSIS — R739 Hyperglycemia, unspecified: Secondary | ICD-10-CM | POA: Diagnosis not present

## 2023-03-12 DIAGNOSIS — D72819 Decreased white blood cell count, unspecified: Secondary | ICD-10-CM | POA: Diagnosis not present

## 2023-05-14 DIAGNOSIS — Z780 Asymptomatic menopausal state: Secondary | ICD-10-CM | POA: Diagnosis not present

## 2023-07-11 DIAGNOSIS — L209 Atopic dermatitis, unspecified: Secondary | ICD-10-CM | POA: Diagnosis not present

## 2023-07-11 DIAGNOSIS — L720 Epidermal cyst: Secondary | ICD-10-CM | POA: Diagnosis not present

## 2023-07-11 DIAGNOSIS — L282 Other prurigo: Secondary | ICD-10-CM | POA: Diagnosis not present

## 2023-07-12 DIAGNOSIS — N764 Abscess of vulva: Secondary | ICD-10-CM | POA: Diagnosis not present

## 2023-07-12 DIAGNOSIS — D259 Leiomyoma of uterus, unspecified: Secondary | ICD-10-CM | POA: Diagnosis not present

## 2023-07-12 DIAGNOSIS — Z6827 Body mass index (BMI) 27.0-27.9, adult: Secondary | ICD-10-CM | POA: Diagnosis not present

## 2023-07-12 DIAGNOSIS — Z01419 Encounter for gynecological examination (general) (routine) without abnormal findings: Secondary | ICD-10-CM | POA: Diagnosis not present

## 2023-07-13 DIAGNOSIS — H2513 Age-related nuclear cataract, bilateral: Secondary | ICD-10-CM | POA: Diagnosis not present

## 2023-07-13 DIAGNOSIS — H25043 Posterior subcapsular polar age-related cataract, bilateral: Secondary | ICD-10-CM | POA: Diagnosis not present

## 2023-07-13 DIAGNOSIS — I10 Essential (primary) hypertension: Secondary | ICD-10-CM | POA: Diagnosis not present

## 2023-07-13 DIAGNOSIS — H40013 Open angle with borderline findings, low risk, bilateral: Secondary | ICD-10-CM | POA: Diagnosis not present

## 2023-07-17 DIAGNOSIS — H40053 Ocular hypertension, bilateral: Secondary | ICD-10-CM | POA: Diagnosis not present

## 2023-07-17 DIAGNOSIS — H5213 Myopia, bilateral: Secondary | ICD-10-CM | POA: Diagnosis not present

## 2023-07-17 DIAGNOSIS — H401130 Primary open-angle glaucoma, bilateral, stage unspecified: Secondary | ICD-10-CM | POA: Diagnosis not present

## 2023-07-31 IMAGING — CT CT NECK W/ CM
2 series · 10 of 14 positions shown, 12 images · IV contrast (iopamidol)
Comparison: None.

CLINICAL DATA: Lymphadenopathy of head and neck. Posterior neck
swelling.

EXAM:
CT NECK WITH CONTRAST
TECHNIQUE: Multidetector CT imaging of the neck was performed using the
standard protocol following the bolus administration of intravenous
contrast.
CONTRAST:  75mL [id number] IOPAMIDOL ([id number]) INJECTION 61%

[Series 3: neck · axial · 0.45mm/px · z∈[+828,+988]mm · 5 of 122 slices shown, 7 images]
[im 21/122  soft-tissue]
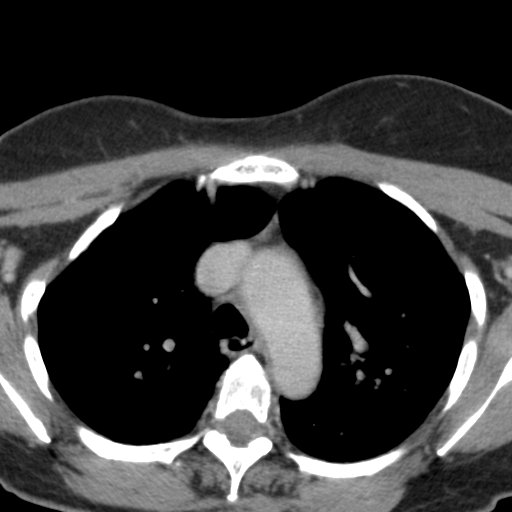
[im 21/122  bone]
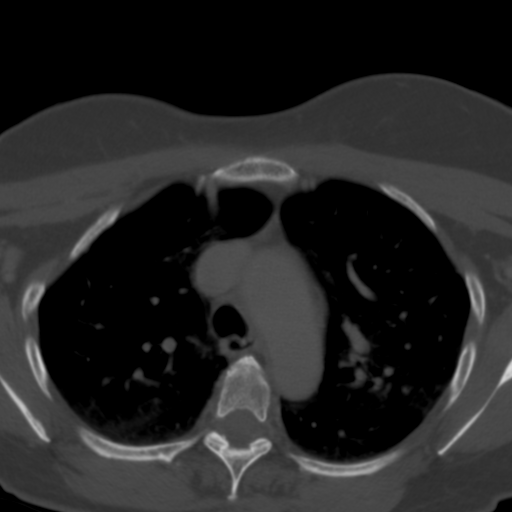
[im 41/122  bone]
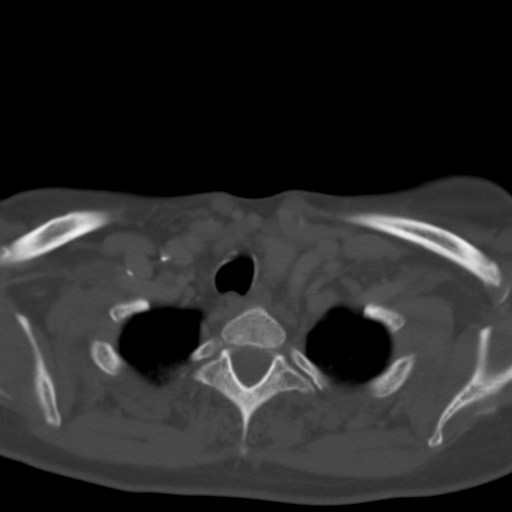
[im 61/122  bone]
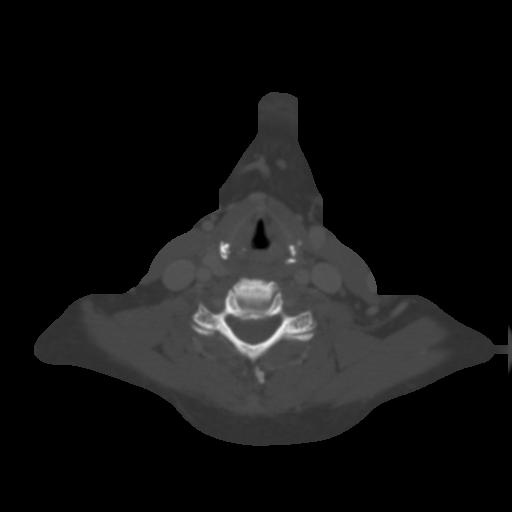
[im 81/122  bone]
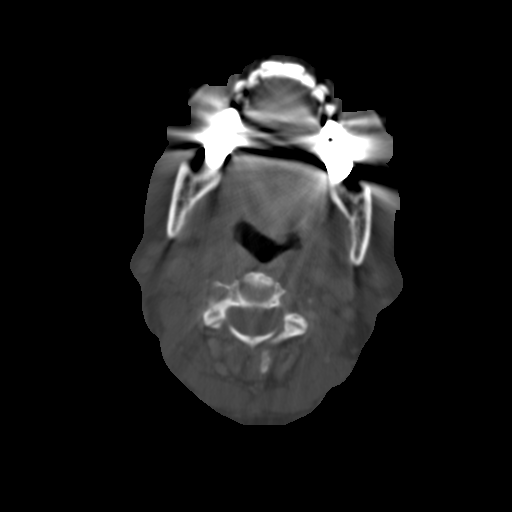
[im 101/122  soft-tissue]
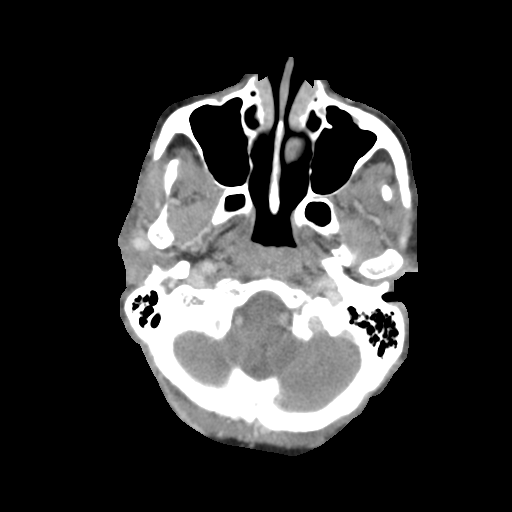
[im 101/122  bone]
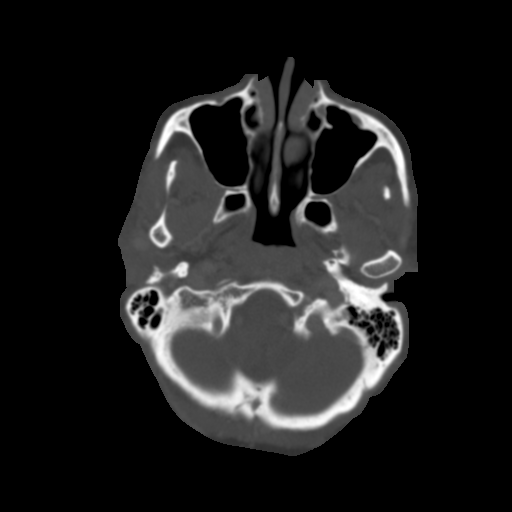

[Series 8: angled axial-oropharynx · axial · 0.39mm/px · z∈[+815,+970]mm · 5 of 119 slices shown]
[im 20/119  bone]
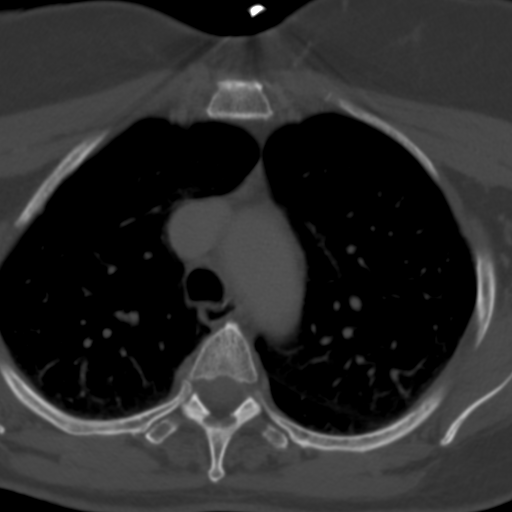
[im 40/119  bone]
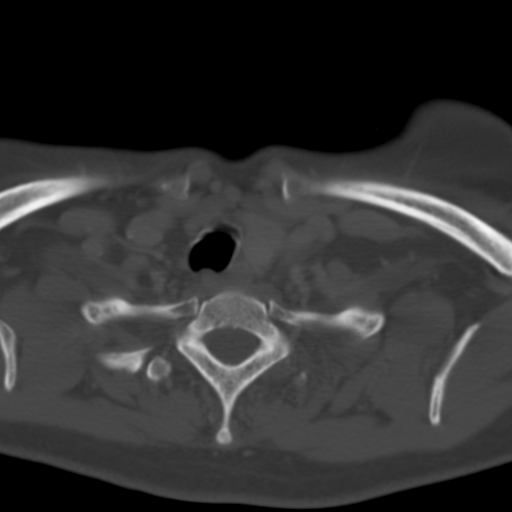
[im 60/119  bone]
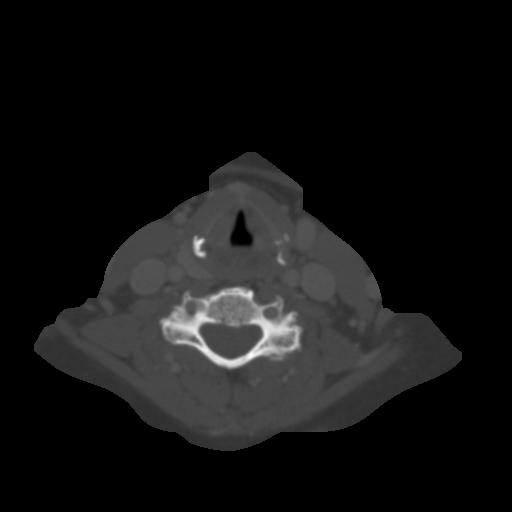
[im 79/119  bone]
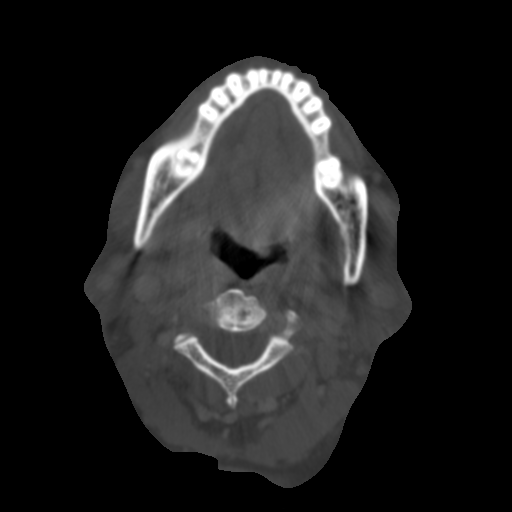
[im 99/119  bone]
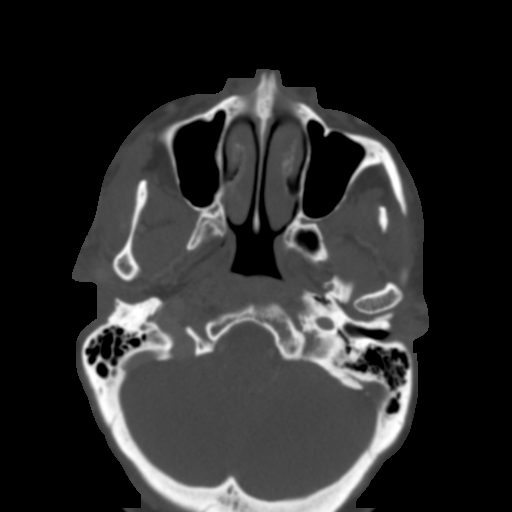

[10 of 14 positions shown; findings below may reference images not displayed]

FINDINGS: Pharynx and larynx: No mass or swelling is identified with
assessment limited by dental streak artifact and moderate motion. No
retropharyngeal fluid collection.

Salivary glands: No inflammation, mass, or stone.

Thyroid: Subcentimeter nodules in the right thyroid lobe and
possible 1.3 cm nodule in the left thyroid lobe for which no imaging
follow-up is recommended.

Lymph nodes: No enlarged or suspicious lymph nodes in the neck.

Vascular: Major vascular structures of the neck are grossly patent.

Limited intracranial: Unremarkable.

Visualized orbits: Unremarkable.

Mastoids and visualized paranasal sinuses: Clear.

Skeleton: No acute osseous abnormality or suspicious osseous lesion.
Moderate cervical spondylosis. Advanced left facet arthrosis at
C4-5.

Upper chest: No apical lung consolidation or mass.

Other: No posterior neck mass, inflammation, or fluid collection is
identified. There is mild asymmetric prominence of the subcutaneous
fat in the posterior right neck in the region of the skin markers
without a discrete, encapsulated lipoma.
IMPRESSION: No acute abnormality, lymphadenopathy, or neck mass identified.

## 2023-08-21 ENCOUNTER — Other Ambulatory Visit (HOSPITAL_BASED_OUTPATIENT_CLINIC_OR_DEPARTMENT_OTHER): Payer: Self-pay

## 2023-08-21 MED ORDER — COMIRNATY 30 MCG/0.3ML IM SUSY
0.3000 mL | PREFILLED_SYRINGE | Freq: Once | INTRAMUSCULAR | 0 refills | Status: AC
  Filled 2023-08-21: qty 0.3, 1d supply, fill #0

## 2023-09-14 DIAGNOSIS — H2513 Age-related nuclear cataract, bilateral: Secondary | ICD-10-CM | POA: Diagnosis not present

## 2023-09-14 DIAGNOSIS — I10 Essential (primary) hypertension: Secondary | ICD-10-CM | POA: Diagnosis not present

## 2023-09-14 DIAGNOSIS — H40013 Open angle with borderline findings, low risk, bilateral: Secondary | ICD-10-CM | POA: Diagnosis not present

## 2024-01-03 DIAGNOSIS — Z1231 Encounter for screening mammogram for malignant neoplasm of breast: Secondary | ICD-10-CM | POA: Diagnosis not present

## 2024-03-07 DIAGNOSIS — Z1212 Encounter for screening for malignant neoplasm of rectum: Secondary | ICD-10-CM | POA: Diagnosis not present

## 2024-03-07 DIAGNOSIS — N182 Chronic kidney disease, stage 2 (mild): Secondary | ICD-10-CM | POA: Diagnosis not present

## 2024-03-07 DIAGNOSIS — I129 Hypertensive chronic kidney disease with stage 1 through stage 4 chronic kidney disease, or unspecified chronic kidney disease: Secondary | ICD-10-CM | POA: Diagnosis not present

## 2024-03-07 DIAGNOSIS — E785 Hyperlipidemia, unspecified: Secondary | ICD-10-CM | POA: Diagnosis not present

## 2024-03-07 DIAGNOSIS — R739 Hyperglycemia, unspecified: Secondary | ICD-10-CM | POA: Diagnosis not present

## 2024-03-07 DIAGNOSIS — E7849 Other hyperlipidemia: Secondary | ICD-10-CM | POA: Diagnosis not present

## 2024-03-12 DIAGNOSIS — Z1212 Encounter for screening for malignant neoplasm of rectum: Secondary | ICD-10-CM | POA: Diagnosis not present

## 2024-03-12 DIAGNOSIS — E7849 Other hyperlipidemia: Secondary | ICD-10-CM | POA: Diagnosis not present

## 2024-03-14 DIAGNOSIS — E785 Hyperlipidemia, unspecified: Secondary | ICD-10-CM | POA: Diagnosis not present

## 2024-03-14 DIAGNOSIS — Z0000 Encounter for general adult medical examination without abnormal findings: Secondary | ICD-10-CM | POA: Diagnosis not present

## 2024-03-14 DIAGNOSIS — R82998 Other abnormal findings in urine: Secondary | ICD-10-CM | POA: Diagnosis not present

## 2024-03-14 DIAGNOSIS — D72819 Decreased white blood cell count, unspecified: Secondary | ICD-10-CM | POA: Diagnosis not present

## 2024-03-14 DIAGNOSIS — R351 Nocturia: Secondary | ICD-10-CM | POA: Diagnosis not present

## 2024-03-14 DIAGNOSIS — I129 Hypertensive chronic kidney disease with stage 1 through stage 4 chronic kidney disease, or unspecified chronic kidney disease: Secondary | ICD-10-CM | POA: Diagnosis not present

## 2024-03-14 DIAGNOSIS — R002 Palpitations: Secondary | ICD-10-CM | POA: Diagnosis not present

## 2024-03-14 DIAGNOSIS — Z1339 Encounter for screening examination for other mental health and behavioral disorders: Secondary | ICD-10-CM | POA: Diagnosis not present

## 2024-03-14 DIAGNOSIS — I10 Essential (primary) hypertension: Secondary | ICD-10-CM | POA: Diagnosis not present

## 2024-03-14 DIAGNOSIS — N182 Chronic kidney disease, stage 2 (mild): Secondary | ICD-10-CM | POA: Diagnosis not present

## 2024-03-14 DIAGNOSIS — Z1331 Encounter for screening for depression: Secondary | ICD-10-CM | POA: Diagnosis not present

## 2024-03-14 DIAGNOSIS — D259 Leiomyoma of uterus, unspecified: Secondary | ICD-10-CM | POA: Diagnosis not present

## 2024-03-14 DIAGNOSIS — M25511 Pain in right shoulder: Secondary | ICD-10-CM | POA: Diagnosis not present

## 2024-03-21 DIAGNOSIS — H40013 Open angle with borderline findings, low risk, bilateral: Secondary | ICD-10-CM | POA: Diagnosis not present

## 2024-03-21 DIAGNOSIS — H2513 Age-related nuclear cataract, bilateral: Secondary | ICD-10-CM | POA: Diagnosis not present

## 2024-03-21 DIAGNOSIS — I10 Essential (primary) hypertension: Secondary | ICD-10-CM | POA: Diagnosis not present

## 2024-03-21 DIAGNOSIS — H25043 Posterior subcapsular polar age-related cataract, bilateral: Secondary | ICD-10-CM | POA: Diagnosis not present

## 2024-07-14 DIAGNOSIS — I10 Essential (primary) hypertension: Secondary | ICD-10-CM | POA: Diagnosis not present

## 2024-07-14 DIAGNOSIS — D259 Leiomyoma of uterus, unspecified: Secondary | ICD-10-CM | POA: Diagnosis not present

## 2024-07-14 DIAGNOSIS — Z124 Encounter for screening for malignant neoplasm of cervix: Secondary | ICD-10-CM | POA: Diagnosis not present

## 2024-07-14 DIAGNOSIS — Z6826 Body mass index (BMI) 26.0-26.9, adult: Secondary | ICD-10-CM | POA: Diagnosis not present

## 2024-07-14 DIAGNOSIS — Z1151 Encounter for screening for human papillomavirus (HPV): Secondary | ICD-10-CM | POA: Diagnosis not present

## 2024-07-18 DIAGNOSIS — H40053 Ocular hypertension, bilateral: Secondary | ICD-10-CM | POA: Diagnosis not present

## 2024-07-18 DIAGNOSIS — H5213 Myopia, bilateral: Secondary | ICD-10-CM | POA: Diagnosis not present

## 2024-07-18 DIAGNOSIS — H401130 Primary open-angle glaucoma, bilateral, stage unspecified: Secondary | ICD-10-CM | POA: Diagnosis not present
